# Patient Record
Sex: Female | Born: 2000 | Race: White | Hispanic: Yes | Marital: Single | Smoking: Current some day smoker
Health system: Southern US, Community
[De-identification: ages and names within clinical notes are randomized; demographics above are authoritative.]

---

## 2019-05-08 ENCOUNTER — Ambulatory Visit (LOCAL_COMMUNITY_HEALTH_CENTER): Payer: Medicaid Other | Admitting: Physician Assistant

## 2019-05-08 ENCOUNTER — Encounter: Payer: Self-pay | Admitting: Physician Assistant

## 2019-05-08 ENCOUNTER — Other Ambulatory Visit: Payer: Self-pay

## 2019-05-08 VITALS — BP 122/72 | Ht 64.0 in | Wt 124.0 lb

## 2019-05-08 DIAGNOSIS — Z30013 Encounter for initial prescription of injectable contraceptive: Secondary | ICD-10-CM | POA: Diagnosis not present

## 2019-05-08 DIAGNOSIS — Z113 Encounter for screening for infections with a predominantly sexual mode of transmission: Secondary | ICD-10-CM

## 2019-05-08 DIAGNOSIS — Z3009 Encounter for other general counseling and advice on contraception: Secondary | ICD-10-CM | POA: Diagnosis not present

## 2019-05-08 MED ORDER — MEDROXYPROGESTERONE ACETATE 150 MG/ML IM SUSP
150.0000 mg | INTRAMUSCULAR | Status: AC
  Administered 2019-05-08: 150 mg via INTRAMUSCULAR

## 2019-05-08 NOTE — Progress Notes (Signed)
Pt here as she is interested in starting Depo. Pt reports that the last time she had sex about 1 month ago she used ECP the day after she had unprotected sex. Pt desires blood work for HIV and syphillis.Ronny Bacon, RN

## 2019-05-08 NOTE — Progress Notes (Signed)
Pt received Depo 150mg  IM today per Antoine Primas, PA order and verbal order. Pt tolerated well. Provider orders completed.Ronny Bacon, RN

## 2019-05-09 NOTE — Progress Notes (Signed)
Family Planning Visit-  Subjective:  Courtney Gentry is a 18 y.o. being seen today  to discuss family planning options.    She is currently using none for pregnancy prevention. Patient reports she does not want a pregnancy in the next year. Patient  does not have a problem list on file.  Chief Complaint  Patient presents with  . Contraception    interested in Depo    Patient reports that she is interested in [facility].  LMP 04/19/2019 and per patient was on time and normal even though she had used Plan B a few days before her period.  Last sex 1 month ago.  Would like to start Depo today.  Declines pregnancy test today.  Reports that she sometimes smokes cigarettes.  Patient denies any chronic medical conditions, surgeries, regular medicines, clotting disorders, and migraines.  Denies family history of heart disease, diabetes, and cancer.   Does the patient desire a pregnancy in the next year? (OKQ flowsheet)  See flowsheet for other program required questions.   Body mass index is 21.28 kg/m. - Patient is eligible for diabetes screening based on BMI and age >73?  not applicable XT0G ordered? not applicable  Patient reports 1 of partners in last year. Desires STI screening?  No - .  Does the patient have a current or past history of drug use? No   No components found for: HCV]   Health Maintenance Due  Topic Date Due  . HIV Screening  09/04/2013  . TETANUS/TDAP  09/04/2017  . INFLUENZA VACCINE  02/28/2019    ROS  The following portions of the patient's history were reviewed and updated as appropriate: allergies, current medications, past family history, past medical history, past social history, past surgical history and problem list. Problem list updated.  Objective:   Vitals:   05/08/19 1421  BP: 122/72  Weight: 124 lb (56.2 kg)  Height: 5\' 4"  (1.626 m)    Physical Exam    Assessment and Plan:  Courtney Gentry is a 18 y.o. female presenting to the  [facility] for a family planning visit  Contraception counseling: Reviewed all forms of birth control options available including abstinence; over the counter/barrier methods; hormonal contraceptive medication including pill, patch, ring, injection,contraceptive implant; hormonal and nonhormonal IUDs; permanent sterilization options including vasectomy and the various tubal sterilization modalities. Risks and benefits reviewed.  Questions were answered.  Written information was also given to the patient to review.  Patient desires Depo, this was prescribed for patient. She will follow up in  3 months and prn for surveillance.  She was told to call with any further questions, or with any concerns about this method of contraception.  Emphasized use of condoms 100% of the time for STI prevention.  1. Encounter for counseling regarding contraception Counseled patient as above on BCMs. Patient opts to start Depo. Patient will need to RTC for IP/RP visit within the next 6 months for PE, pap, and to continue with Depo. Rec condoms with all sex for STD protection and especially for 2 weeks after shot today.  2. Initiation of Depo Provera OK for Depo 150 mg IM today and in 11-13 weeks. Rec condoms with all sex. - medroxyPROGESTERone (DEPO-PROVERA) injection 150 mg  3. Screening for STD (sexually transmitted disease) Patient declines pelvic today and GC/Chlamydia testing.  Requests blood work for HIV and RPR today. Await test results.  Counseled that RN will call if needs to RTC if needs  treatment once results are back.     Return in about 11 weeks (around 07/24/2019) for Physical and Depo, and PRN .  No future appointments.  Matt Holmes, PA

## 2019-05-20 NOTE — Addendum Note (Signed)
Addended by: Antoine Primas on: 05/20/2019 10:09 AM   Modules accepted: Orders

## 2019-06-09 NOTE — Addendum Note (Signed)
Addended by: Antoine Primas on: 06/09/2019 01:18 PM   Modules accepted: Orders

## 2019-07-30 ENCOUNTER — Ambulatory Visit (LOCAL_COMMUNITY_HEALTH_CENTER): Payer: Medicaid Other

## 2019-07-30 ENCOUNTER — Other Ambulatory Visit: Payer: Self-pay

## 2019-07-30 VITALS — BP 132/79 | Ht 64.0 in | Wt 127.0 lb

## 2019-07-30 DIAGNOSIS — Z3042 Encounter for surveillance of injectable contraceptive: Secondary | ICD-10-CM

## 2019-07-30 DIAGNOSIS — Z30013 Encounter for initial prescription of injectable contraceptive: Secondary | ICD-10-CM | POA: Diagnosis not present

## 2019-07-30 DIAGNOSIS — Z3009 Encounter for other general counseling and advice on contraception: Secondary | ICD-10-CM

## 2019-07-30 MED ORDER — MEDROXYPROGESTERONE ACETATE 150 MG/ML IM SUSP
150.0000 mg | Freq: Once | INTRAMUSCULAR | Status: AC
  Administered 2019-07-30: 09:00:00 150 mg via INTRAMUSCULAR

## 2019-07-30 NOTE — Progress Notes (Signed)
No physical at [facility]. Depo start at [facility] on 05/08/2019; 11.6 weeks post depo today. DMPA 150 mg IM administered today per Carla Hampton's order dated 05/08/2019. Pt must have physical before next depo is administered.

## 2019-10-21 ENCOUNTER — Other Ambulatory Visit: Payer: Self-pay | Admitting: Physician Assistant

## 2019-10-21 DIAGNOSIS — Z3042 Encounter for surveillance of injectable contraceptive: Secondary | ICD-10-CM

## 2019-10-21 MED ORDER — MEDROXYPROGESTERONE ACETATE 150 MG/ML IM SUSP
150.0000 mg | INTRAMUSCULAR | Status: AC
  Administered 2019-10-22 – 2020-01-08 (×2): 150 mg via INTRAMUSCULAR

## 2019-10-21 NOTE — Progress Notes (Signed)
Per chart review, patient needs PE and pap this year if possible due to COVID restrictions.  Provided that patient desires to continue with Depo and BP is normal, may continue with Depo.  Will need provider visit for pap and Depo in October/November.

## 2019-10-22 ENCOUNTER — Ambulatory Visit (LOCAL_COMMUNITY_HEALTH_CENTER): Payer: Medicaid Other

## 2019-10-22 ENCOUNTER — Other Ambulatory Visit: Payer: Self-pay

## 2019-10-22 VITALS — BP 128/75 | Ht 64.0 in | Wt 132.0 lb

## 2019-10-22 DIAGNOSIS — Z30013 Encounter for initial prescription of injectable contraceptive: Secondary | ICD-10-CM

## 2019-10-22 DIAGNOSIS — Z3009 Encounter for other general counseling and advice on contraception: Secondary | ICD-10-CM

## 2019-10-22 DIAGNOSIS — Z3042 Encounter for surveillance of injectable contraceptive: Secondary | ICD-10-CM

## 2019-10-22 NOTE — Progress Notes (Signed)
12.0 weeks post depo today. DMPA 150 mg IM administered per Sadie Haber, PA order dated 10/21/19 (depo order for 10/22/19 - 04/18/20).

## 2020-01-08 ENCOUNTER — Other Ambulatory Visit: Payer: Self-pay

## 2020-01-08 ENCOUNTER — Ambulatory Visit (LOCAL_COMMUNITY_HEALTH_CENTER): Payer: Medicaid Other

## 2020-01-08 VITALS — BP 114/80 | Ht 64.0 in | Wt 134.0 lb

## 2020-01-08 DIAGNOSIS — Z3009 Encounter for other general counseling and advice on contraception: Secondary | ICD-10-CM

## 2020-01-08 DIAGNOSIS — Z30013 Encounter for initial prescription of injectable contraceptive: Secondary | ICD-10-CM | POA: Diagnosis not present

## 2020-01-08 DIAGNOSIS — Z3042 Encounter for surveillance of injectable contraceptive: Secondary | ICD-10-CM

## 2020-01-08 NOTE — Progress Notes (Signed)
Pt. Is 11 weeks 1 day post depo. Depo 150mg  IM given today per order by C. Corydon, STAVANGER dated 10/21/19. Consent on file.  10/23/19, RN

## 2020-12-30 ENCOUNTER — Emergency Department
Admission: EM | Admit: 2020-12-30 | Discharge: 2020-12-30 | Disposition: A | Discharge status: Home / Self Care | Payer: Worker's Compensation | Attending: Emergency Medicine | Admitting: Emergency Medicine

## 2020-12-30 ENCOUNTER — Other Ambulatory Visit: Payer: Self-pay

## 2020-12-30 ENCOUNTER — Emergency Department: Payer: Worker's Compensation

## 2020-12-30 DIAGNOSIS — M25561 Pain in right knee: Secondary | ICD-10-CM

## 2020-12-30 DIAGNOSIS — T1490XA Injury, unspecified, initial encounter: Secondary | ICD-10-CM

## 2020-12-30 DIAGNOSIS — S80911A Unspecified superficial injury of right knee, initial encounter: Secondary | ICD-10-CM | POA: Diagnosis not present

## 2020-12-30 DIAGNOSIS — X500XXA Overexertion from strenuous movement or load, initial encounter: Secondary | ICD-10-CM | POA: Diagnosis not present

## 2020-12-30 DIAGNOSIS — F17210 Nicotine dependence, cigarettes, uncomplicated: Secondary | ICD-10-CM | POA: Insufficient documentation

## 2020-12-30 DIAGNOSIS — Y990 Civilian activity done for income or pay: Secondary | ICD-10-CM | POA: Diagnosis not present

## 2020-12-30 MED ORDER — IBUPROFEN 400 MG PO TABS
400.0000 mg | ORAL_TABLET | Freq: Once | ORAL | Status: AC
  Administered 2020-12-30: 400 mg via ORAL
  Filled 2020-12-30: qty 1

## 2020-12-30 MED ORDER — ACETAMINOPHEN 500 MG PO TABS
1000.0000 mg | ORAL_TABLET | Freq: Once | ORAL | Status: AC
  Administered 2020-12-30: 1000 mg via ORAL
  Filled 2020-12-30: qty 2

## 2020-12-30 NOTE — ED Notes (Signed)
See triage note  Presents with right knee pain  States she had an accident at work 2 days ago  States something fell on knee  Needs interpreter

## 2020-12-30 NOTE — ED Provider Notes (Signed)
[facility] Emergency Department Provider Note  ____________________________________________   Event Date/Time   First MD Initiated Contact with Patient 12/30/20 1450     (approximate)  I have reviewed the triage vital signs and the nursing notes.   HISTORY  Chief Complaint Knee Pain   HPI Courtney Gentry is a 20 y.o. female without significant past medical history presents for assessment approximately 3 days of pain in her right knee after she states that a pallet fell onto it while she was at work.  She states that she has been able to bear weight albeit with some discomfort.  She denies any other injuries associated with this pallet falling onto her including any pain in her ankle or hip.  She has not taken any medications for her pain.  She denies any other associated sick symptoms including fevers, chills, headache, earache, sore throat, nausea, vomiting, diarrhea, dysuria, rash or any other acute sick symptoms.  No other acute concerns at this time.  States that she is currently on her menstrual period.  She does not recall any deformity at the time that was spontaneously reduced.         No past medical history on file.  There are no problems to display for this patient.     Prior to Admission medications   Medication Sig Start Date End Date Taking? Authorizing Provider  cholecalciferol (VITAMIN D3) 25 MCG (1000 UNIT) tablet Take 1,000 Units by mouth daily. Pt unsure of dosage    [provider]    Allergies Patient has no known allergies.  No family history on file.  Social History Social History   Tobacco Use  . Smoking status: Current Some Day Smoker    Types: Cigarettes    Start date: 06/07/2018  . Smokeless tobacco: Never Used    Review of Systems  Review of Systems  Constitutional: Negative for chills and fever.  HENT: Negative for sore throat.   Eyes: Negative for pain.  Respiratory: Negative for cough  and stridor.   Cardiovascular: Negative for chest pain.  Gastrointestinal: Negative for vomiting.  Genitourinary: Negative for dysuria.  Musculoskeletal: Positive for joint pain ( R knee). Negative for myalgias.  Skin: Negative for rash.  Neurological: Negative for seizures, loss of consciousness and headaches.  Psychiatric/Behavioral: Negative for suicidal ideas.  All other systems reviewed and are negative.     ____________________________________________   PHYSICAL EXAM:  VITAL SIGNS: ED Triage Vitals  Enc Vitals Group     BP 12/30/20 1345 119/77     Pulse Rate 12/30/20 1345 71     Resp 12/30/20 1345 16     Temp 12/30/20 1345 98.9 F (37.2 C)     Temp Source 12/30/20 1345 Oral     SpO2 12/30/20 1345 100 %     Weight 12/30/20 1353 165 lb (74.8 kg)     Height 12/30/20 1353 5\' 8"  (1.727 m)     Head Circumference --      Peak Flow --      Pain Score 12/30/20 1353 7     Pain Loc --      Pain Edu? --      Excl. in GC? --    Vitals:   12/30/20 1345  BP: 119/77  Pulse: 71  Resp: 16  Temp: 98.9 F (37.2 C)  SpO2: 100%   Physical Exam Vitals and nursing note reviewed.  Constitutional:      General: She is not in  acute distress.    Appearance: She is well-developed.  HENT:     Head: Normocephalic and atraumatic.     Right Ear: External ear normal.     Left Ear: External ear normal.     Nose: Nose normal.  Eyes:     Conjunctiva/sclera: Conjunctivae normal.  Cardiovascular:     Rate and Rhythm: Normal rate and regular rhythm.     Heart sounds: No murmur heard.   Pulmonary:     Effort: Pulmonary effort is normal. No respiratory distress.  Abdominal:     Palpations: Abdomen is soft.     Tenderness: There is no abdominal tenderness.  Musculoskeletal:     Cervical back: Neck supple.  Skin:    General: Skin is warm and dry.  Neurological:     Mental Status: She is alert and oriented to person, place, and time.  Psychiatric:        Mood and Affect: Mood  normal.     Right knee has some tenderness and a little bit of pain elicited on valgus testing but no laxity deformity or large effusion.  No significant overlying skin changes.  She is neurovascular intact distally. ____________________________________________   LABS (all labs ordered are listed, but only abnormal results are displayed)  Labs Reviewed - No data to display ____________________________________________  EKG  ____________________________________________  RADIOLOGY  ED MD interpretation: No fracture or dislocation.  Official radiology report(s): DG Knee Complete 4 Views Right  Result Date: 12/30/2020 CLINICAL DATA:  Knee injury EXAM: RIGHT KNEE - COMPLETE 4+ VIEW COMPARISON:  None. FINDINGS: No evidence of fracture, dislocation, or joint effusion. No evidence of arthropathy or other focal bone abnormality. Soft tissues are unremarkable. IMPRESSION: Negative. Electronically Signed   By: Guadlupe Spanish M.D.   On: 12/30/2020 14:50    ____________________________________________   PROCEDURES  Procedure(s) performed (including Critical Care):  Procedures   ____________________________________________   INITIAL IMPRESSION / ASSESSMENT AND PLAN / ED COURSE      Patient presents with above-stated history exam for assessment of a mechanical injury to the right knee described above.  On arrival she is afebrile hemodynamically stable.  No fracture dislocation noted on plain film and she is neurovascularly intact distally.  She does have little tenderness of the medial aspect and a little pain elicited on valgus testing concerning for possible medial collateral ligament injury.  Will place in sleeve brace and outpatient follow-up with Ortho.  Low suspicion for septic joint, spontaneous reduced dislocation or other significant injury.  No findings to suggest acute infectious process.  Advise she can take OTC pain meds.  Discharged stable condition.  Strict return precautions  advised and discussed.        ____________________________________________   FINAL CLINICAL IMPRESSION(S) / ED DIAGNOSES  Final diagnoses:  Injury  Acute pain of right knee    Medications  acetaminophen (TYLENOL) tablet 1,000 mg (has no administration in time range)  ibuprofen (ADVIL) tablet 400 mg (has no administration in time range)     ED Discharge Orders    None       Note:  This document was prepared using Dragon voice recognition software and may include unintentional dictation errors.   Gilles Chiquito, MD 12/30/20 [phone]

## 2020-12-30 NOTE — ED Triage Notes (Addendum)
Pt here with an accident at work on Wed and is not getting better. Pt fell while pulling a lift and it fell on top of her. Pt works at [employer] in [place].

## 2021-05-19 ENCOUNTER — Other Ambulatory Visit: Payer: Self-pay | Admitting: Family Medicine

## 2021-05-19 DIAGNOSIS — Z3401 Encounter for supervision of normal first pregnancy, first trimester: Secondary | ICD-10-CM

## 2021-05-31 ENCOUNTER — Other Ambulatory Visit: Payer: Self-pay

## 2021-05-31 ENCOUNTER — Ambulatory Visit
Admission: RE | Admit: 2021-05-31 | Discharge: 2021-05-31 | Disposition: A | Discharge status: Home / Self Care | Payer: Medicaid Other | Source: Ambulatory Visit | Attending: Family Medicine | Admitting: Family Medicine

## 2021-05-31 ENCOUNTER — Other Ambulatory Visit: Payer: Self-pay | Admitting: Family Medicine

## 2021-05-31 DIAGNOSIS — Z3401 Encounter for supervision of normal first pregnancy, first trimester: Secondary | ICD-10-CM | POA: Insufficient documentation

## 2021-11-13 ENCOUNTER — Telehealth: Payer: Self-pay | Admitting: Licensed Clinical Social Worker

## 2021-11-13 NOTE — Telephone Encounter (Signed)
-----   Message from Kieth Brightly sent at 11/10/2021 10:35 AM EDT ----- ?Regarding: RE: [facility] referral ?Ok. Thanks again. Have a great weekend.  ?----- Message ----- ?From: Milton Ferguson, LCSW ?Sent: 11/09/2021   5:10 PM EDT ?To: Kieth Brightly ?Subject: RE: MH referral                               ? ?Good evening! Sure thing, I will do!  ?----- Message ----- ?From: Kieth Brightly ?Sent: 11/09/2021   4:47 PM EDT ?To: Milton Ferguson, LCSW ?Subject: MH referral                                   ? ?[place] all is well.  At your convenience, would you please f/u with this member on some possible pp depression? ?Thanks,  ? ? ? ?

## 2021-11-15 NOTE — Telephone Encounter (Signed)
TELEHEALTH VIRTUAL MOOD VISIT ENCOUNTER NOTE ? ?I connected with Courtney Gentry on 11/15/21 by telephone and verified that I am speaking with the correct person using two identifiers. ?  ?I discussed the limitations, risks, security and privacy concerns of performing an evaluation and management service by telephone and the availability of in person appointments. I also discussed with the patient that there may be a patient responsible charge related to this service. The patient expressed understanding and agreed to proceed. ? ?LCSW introduced self and established psychological safety.  ?Patient verbally consented to this telephonic session.  ? ?Patient is located at Home  ?LCSW located at [facility] ? ?Time visit started: 11:50 ?Time visit ended: 11:58 ? ?SUBJECTIVE: ?Patient was referred by Ellison Carwin, MSW Care Manager for possible postpartum depression symptoms.  ? ?ASSESSMENT: ?Patient currently experiencing Denies any concerns. Reports supportive husband, sleeping well and that things are fine.  ? ? ?PRESENTING CONCERNS: ?Patient and/or family reports the following symptoms/concerns: Patient denies any current concerns or symptoms. She reports she was feeling a little depressed for about 2-3 days after delivery but those feelings has subsided.  ?Duration of problem: 3 days; Severity of problem:  No longer a problem/concerns  ? ?STRENGTHS (Protective Factors/Coping Skills): ?Supportive husband, sleeping well, no concerns.  ? ?INTERVENTIONS: ?Interventions utilized:  Psychoeducation and/or Health Education and Brief assessment  ? ?Conducted brief assessment  ?Provide brief psychoeducation on postpartum mood and anxiety disorders signs and symptoms, including information on Baby Blues, Postpartum Depression, and Postpartum Anxiety.   ?Discussed self-care strategies to prevent and/or reduce symptoms of postpartum mood and anxiety disorders, including sleep hygiene, eating regularly, drinking fluids, getting  outside, and support from partner, family, and/or friends.  ? ?Informed patient to call her provider right away or get emergency help if she experiences any of the following symptoms: ?Feelings of hopelessness and total despair. ?Feeling out of touch with reality (hearing or seeing things other people don't). ?Feeling that you might hurt yourself or your baby. ? ?PLAN: ?Patient to reach out if needed, LCSW provided contact number ? ?Roddie Mc Yemen, Interpreter  ?Kathreen Cosier  ? ?

## 2022-06-01 ENCOUNTER — Other Ambulatory Visit: Payer: Self-pay | Admitting: Family Medicine

## 2022-06-01 DIAGNOSIS — Z331 Pregnant state, incidental: Secondary | ICD-10-CM

## 2022-06-12 ENCOUNTER — Ambulatory Visit: Admission: RE | Admit: 2022-06-12 | Payer: Medicaid Other | Source: Ambulatory Visit

## 2022-06-19 ENCOUNTER — Ambulatory Visit
Admission: RE | Admit: 2022-06-19 | Discharge: 2022-06-19 | Disposition: A | Discharge status: Home / Self Care | Payer: Medicaid Other | Source: Ambulatory Visit | Attending: Family Medicine | Admitting: Family Medicine

## 2022-06-19 ENCOUNTER — Other Ambulatory Visit: Payer: Self-pay | Admitting: Family Medicine

## 2022-06-19 DIAGNOSIS — Z331 Pregnant state, incidental: Secondary | ICD-10-CM | POA: Insufficient documentation

## 2022-06-19 DIAGNOSIS — Z3A12 12 weeks gestation of pregnancy: Secondary | ICD-10-CM | POA: Insufficient documentation

## 2022-06-19 DIAGNOSIS — Z3689 Encounter for other specified antenatal screening: Secondary | ICD-10-CM | POA: Diagnosis not present

## 2022-06-19 DIAGNOSIS — Z3687 Encounter for antenatal screening for uncertain dates: Secondary | ICD-10-CM | POA: Insufficient documentation

## 2022-08-07 ENCOUNTER — Other Ambulatory Visit: Payer: Self-pay | Admitting: Family Medicine

## 2022-08-07 DIAGNOSIS — Z3481 Encounter for supervision of other normal pregnancy, first trimester: Secondary | ICD-10-CM

## 2022-08-13 ENCOUNTER — Ambulatory Visit
Admission: RE | Admit: 2022-08-13 | Discharge: 2022-08-13 | Disposition: A | Discharge status: Home / Self Care | Payer: Medicaid Other | Source: Ambulatory Visit | Attending: Family Medicine | Admitting: Family Medicine

## 2022-08-13 DIAGNOSIS — O321XX0 Maternal care for breech presentation, not applicable or unspecified: Secondary | ICD-10-CM | POA: Diagnosis not present

## 2022-08-13 DIAGNOSIS — Z3481 Encounter for supervision of other normal pregnancy, first trimester: Secondary | ICD-10-CM

## 2022-08-13 DIAGNOSIS — Z3A20 20 weeks gestation of pregnancy: Secondary | ICD-10-CM | POA: Diagnosis not present

## 2022-11-04 IMAGING — CR DG KNEE COMPLETE 4+V*R*
4 series · 4 of 4 positions shown · non-contrast
Comparison: None.

CLINICAL DATA: Knee injury

EXAM:
RIGHT KNEE - COMPLETE 4+ VIEW

[knee ap]
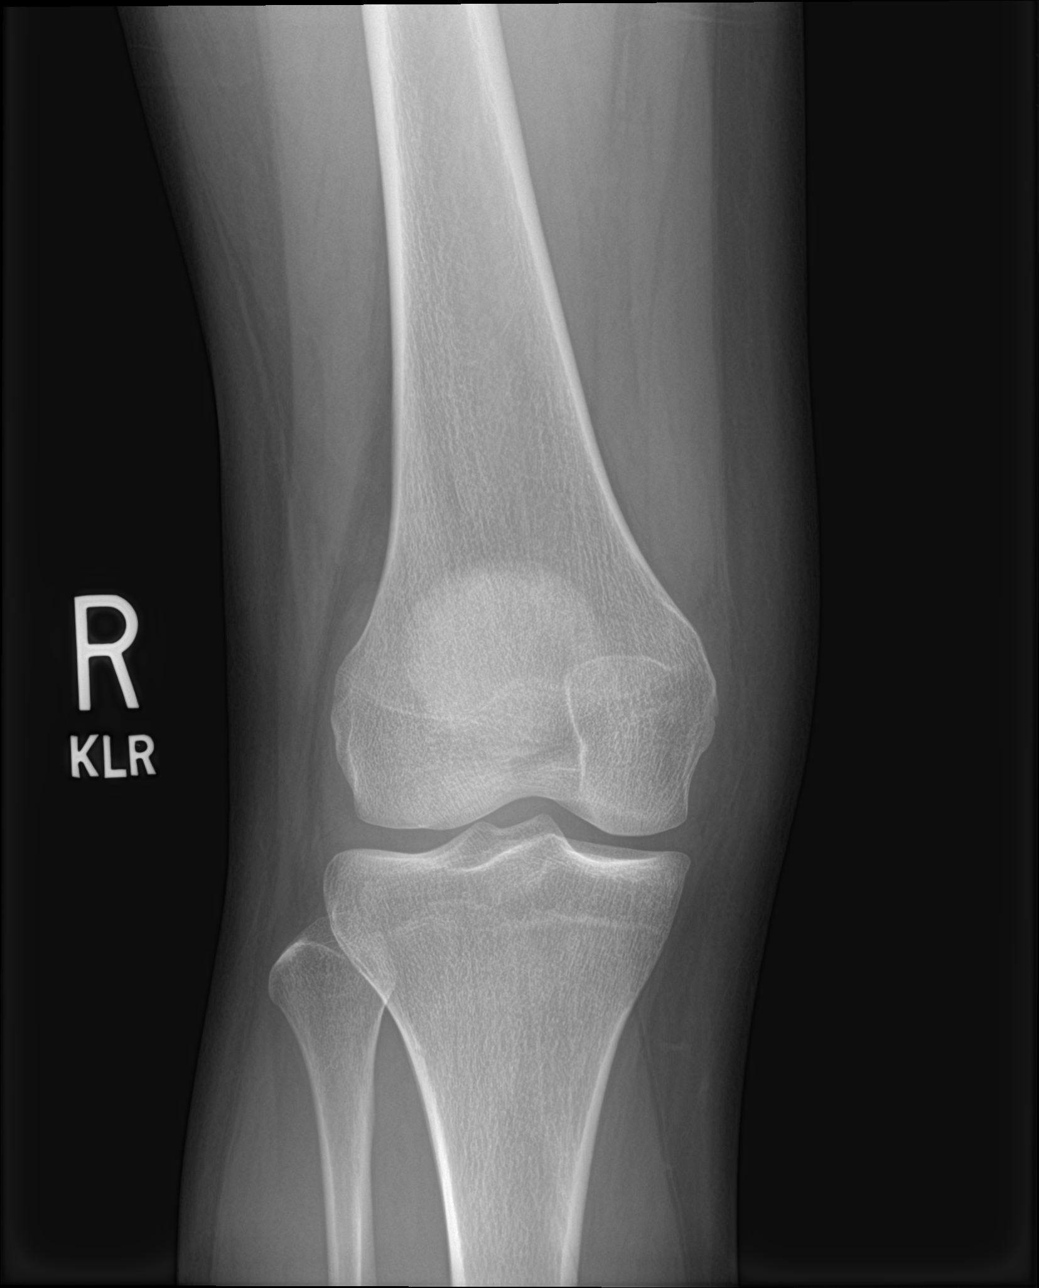

[knee obl (1 of 2)]
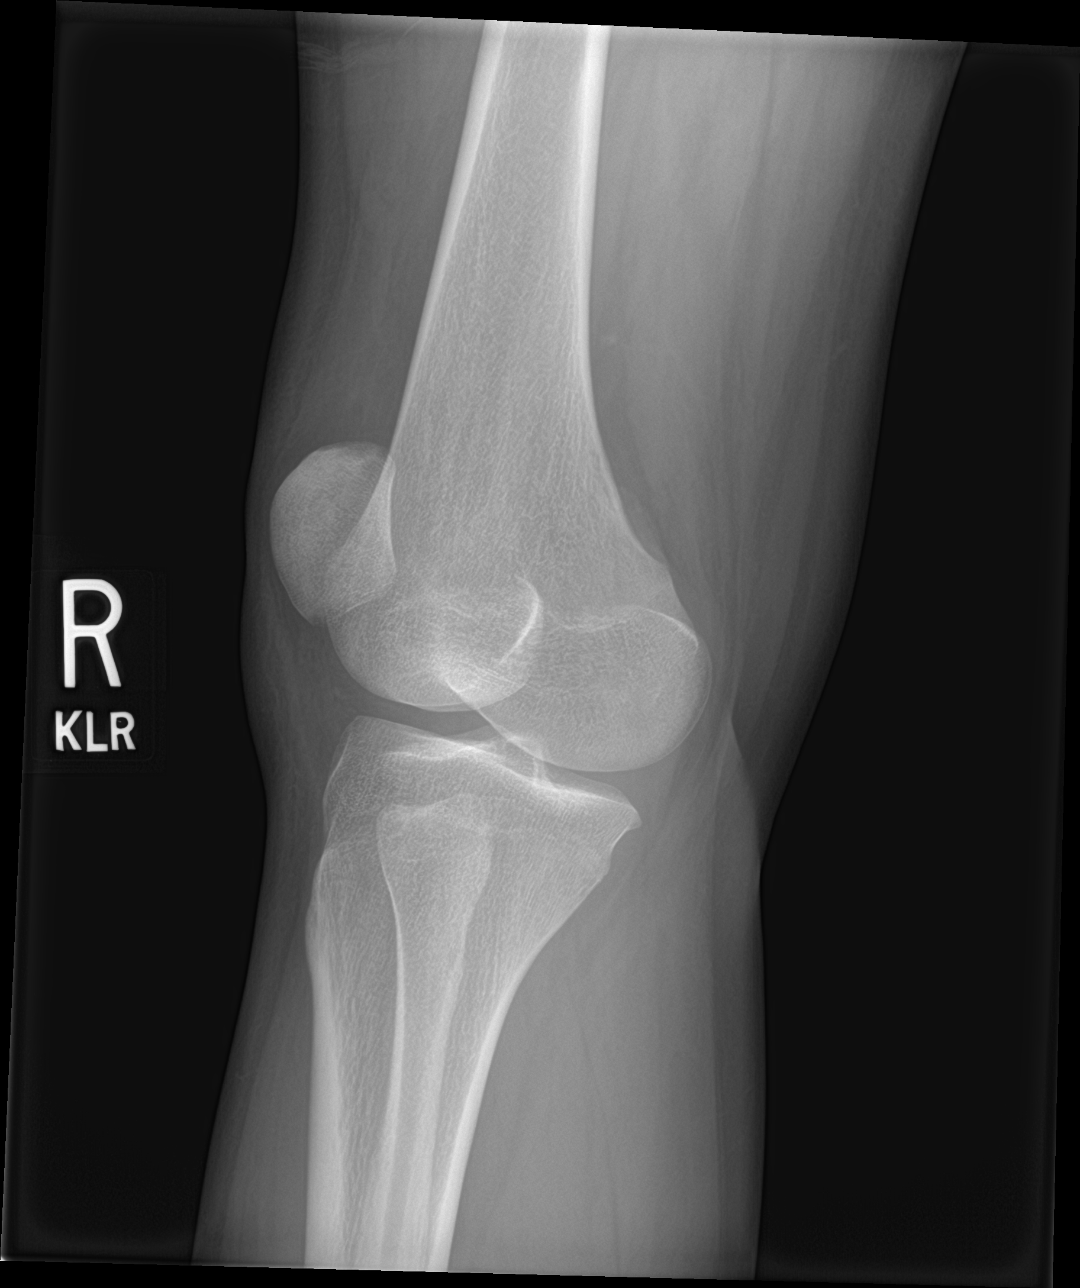

[knee obl (2 of 2)]
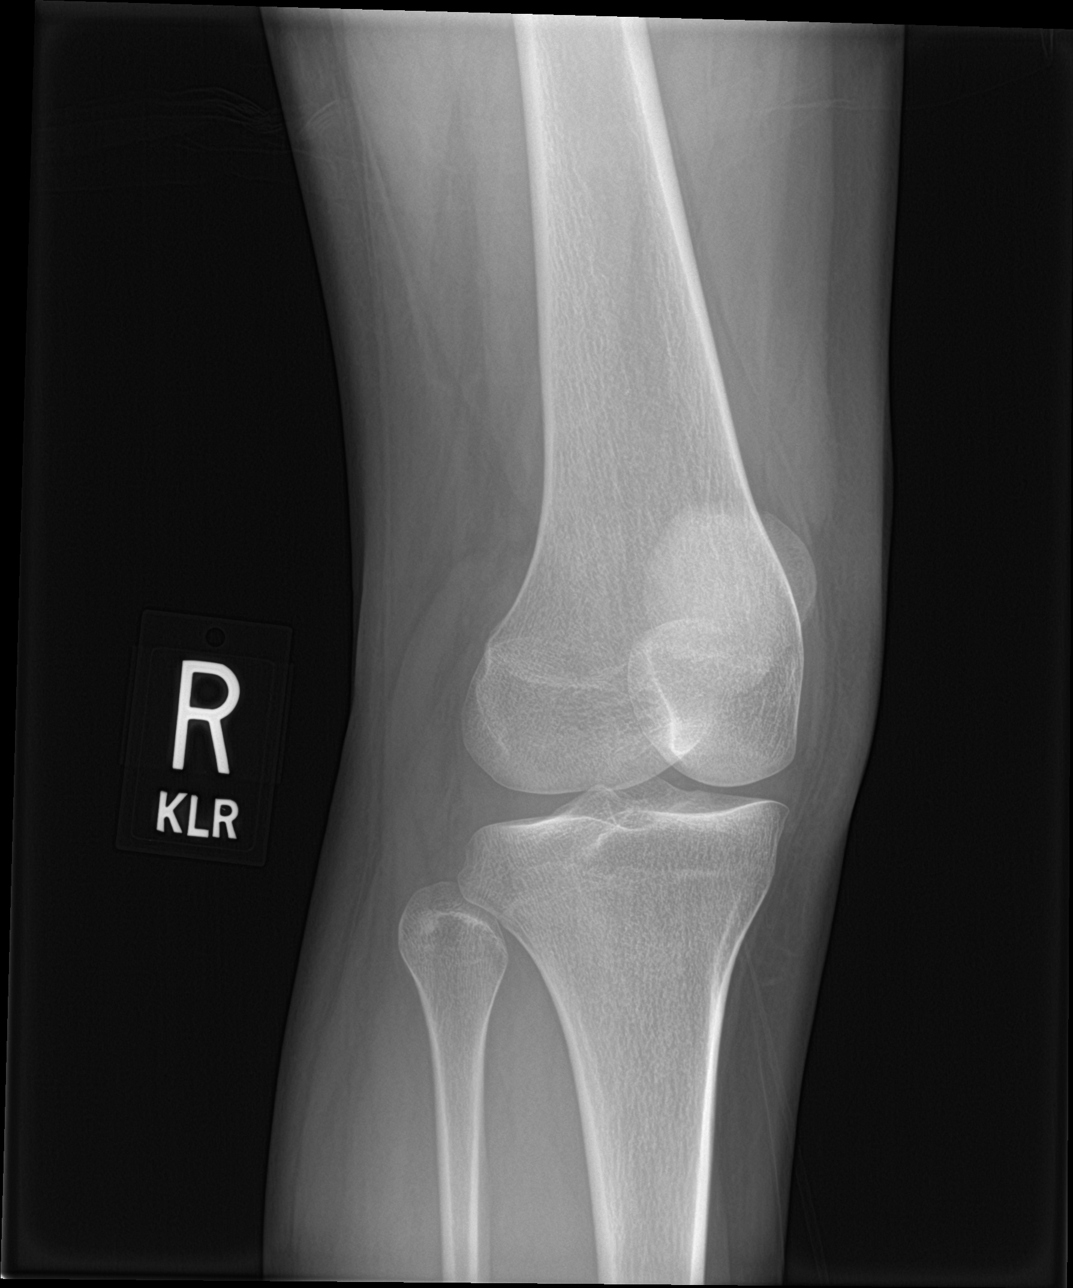

[knee lat]
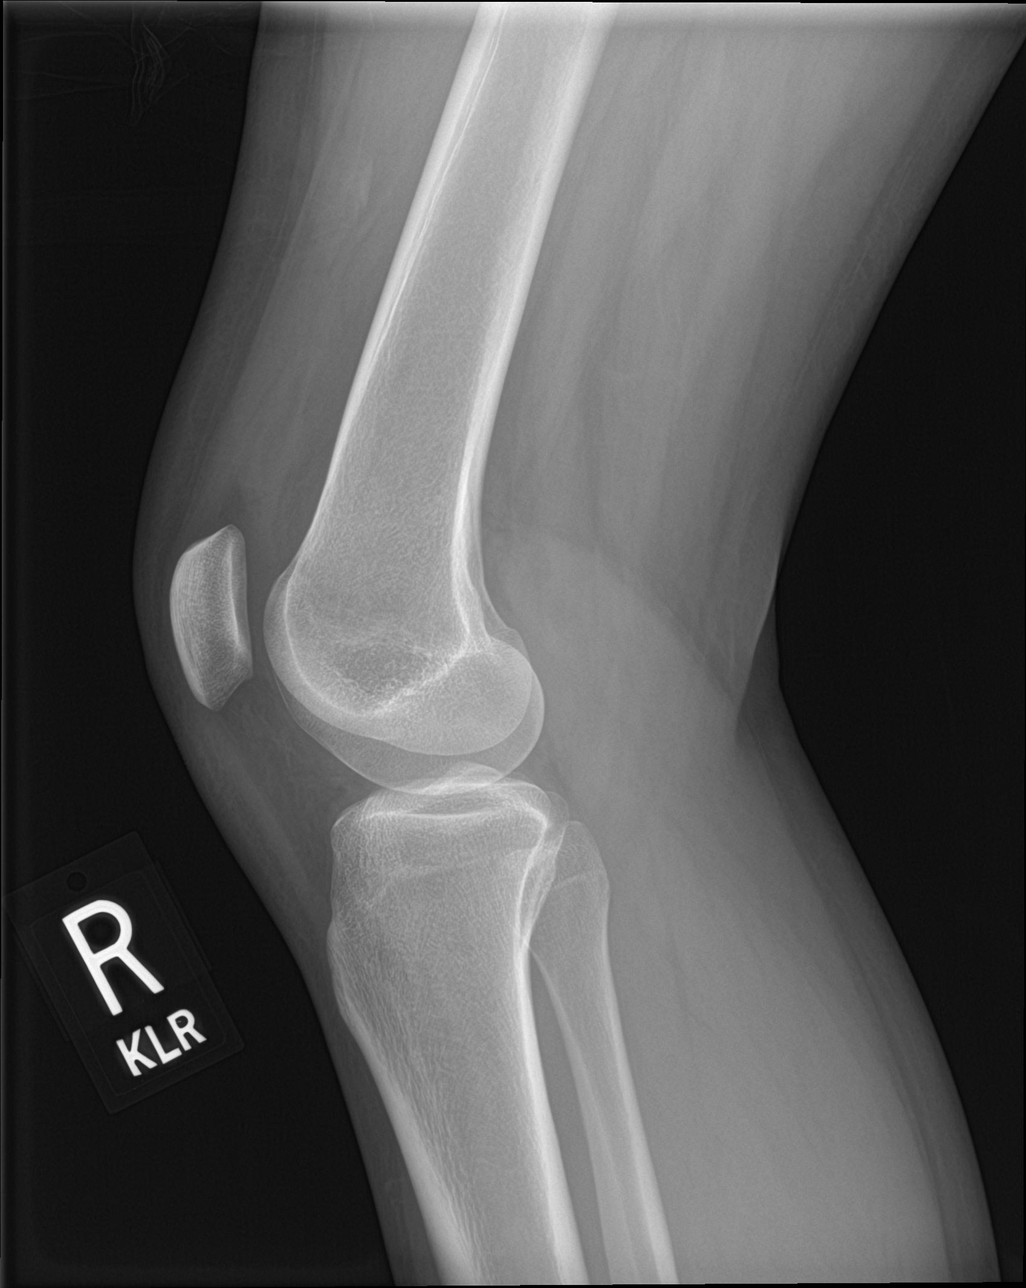

[4 of 4 positions shown; findings below may reference images not displayed]

FINDINGS: No evidence of fracture, dislocation, or joint effusion. No evidence
of arthropathy or other focal bone abnormality. Soft tissues are
unremarkable.
IMPRESSION: Negative.

## 2023-04-05 IMAGING — US US OB COMP +14 WK
1 series · 15 of 28 positions shown · non-contrast
Comparison: none

CLINICAL DATA: Unknown LMP.  Primigravida.

EXAM:
OBSTETRICAL ULTRASOUND >14 WKS

[Series 1: us ob comp less 14 wks · 15 of 68 slices shown]
[im 1/68]
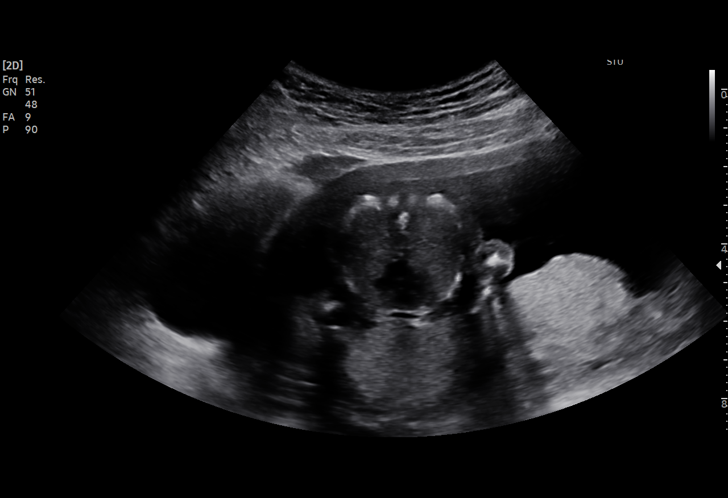
[im 5/68]
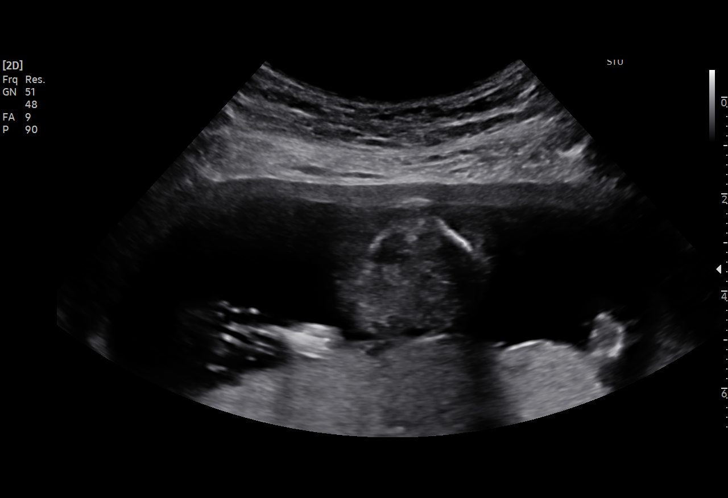
[im 10/68]
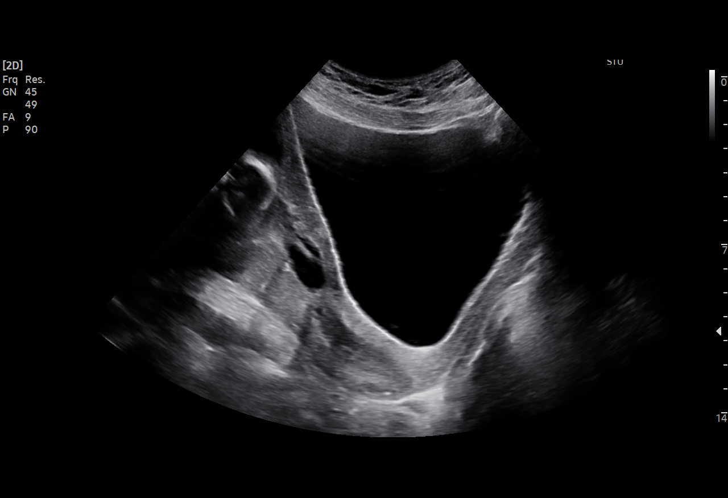
[im 15/68]
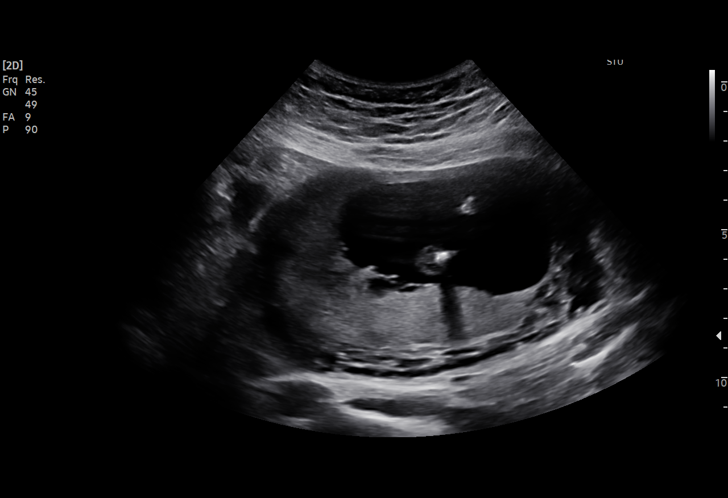
[im 20/68]
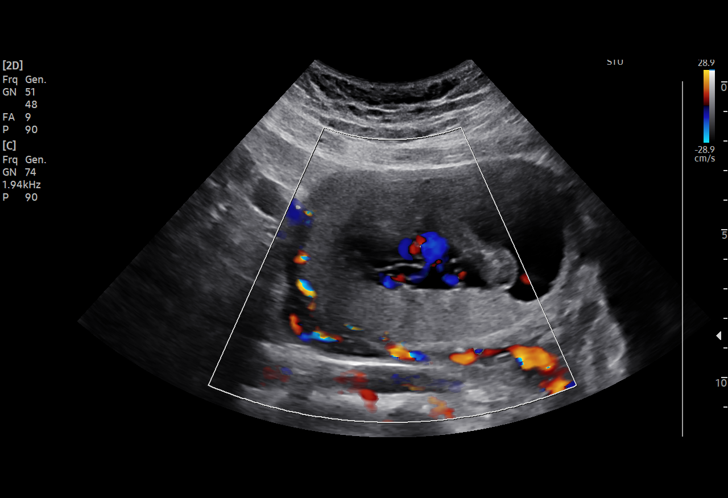
[im 25/68]
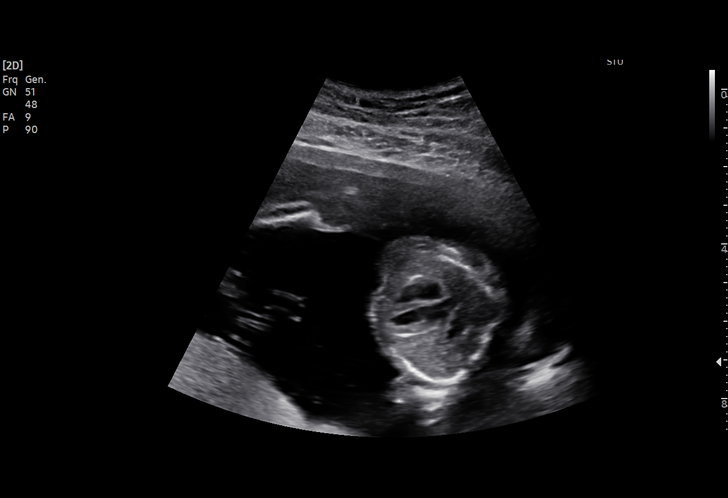
[im 30/68]
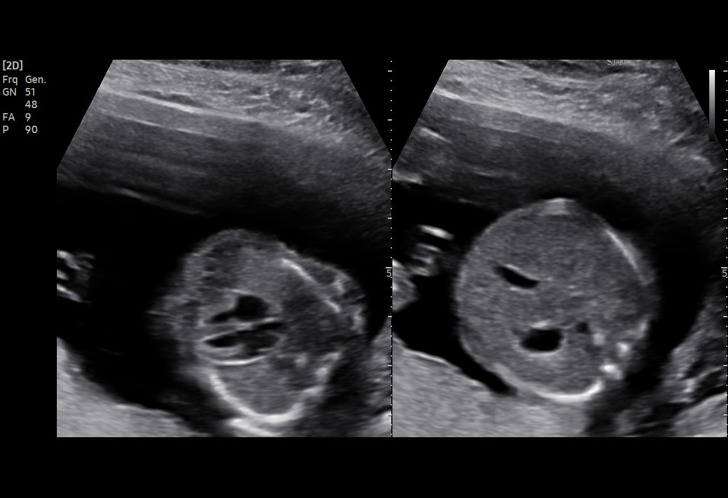
[im 35/68]
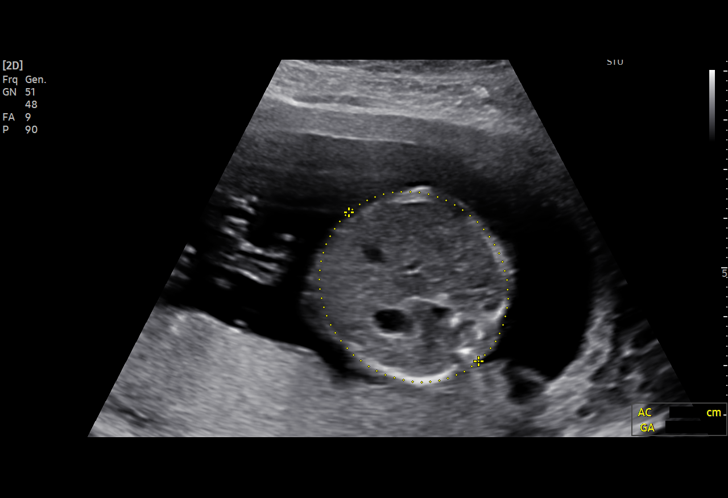
[im 38/68]
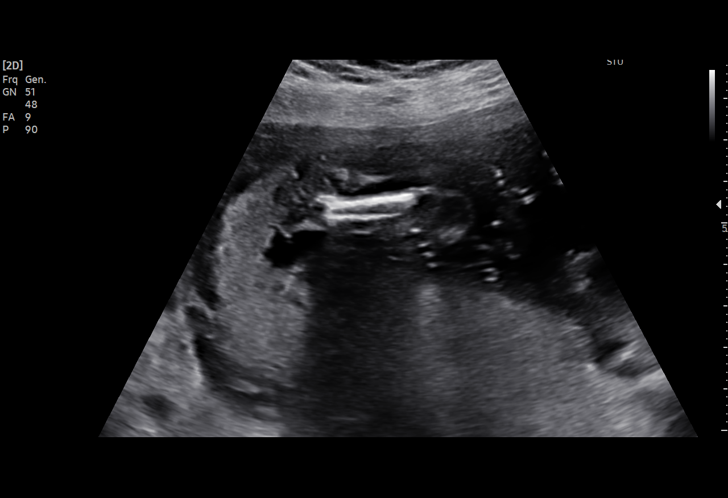
[im 43/68]
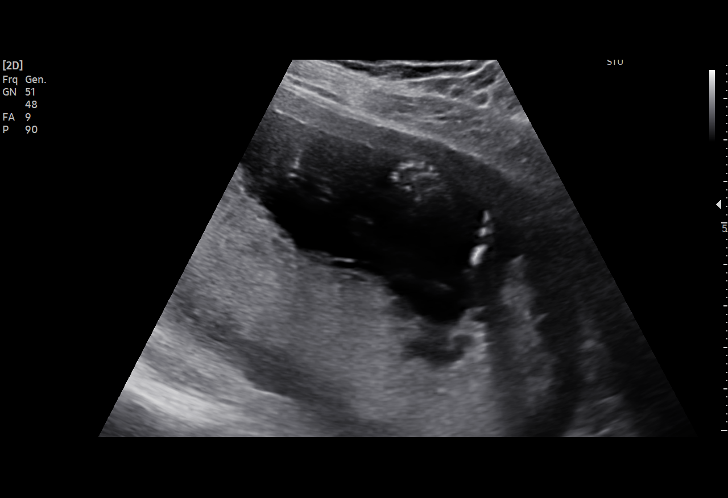
[im 48/68]
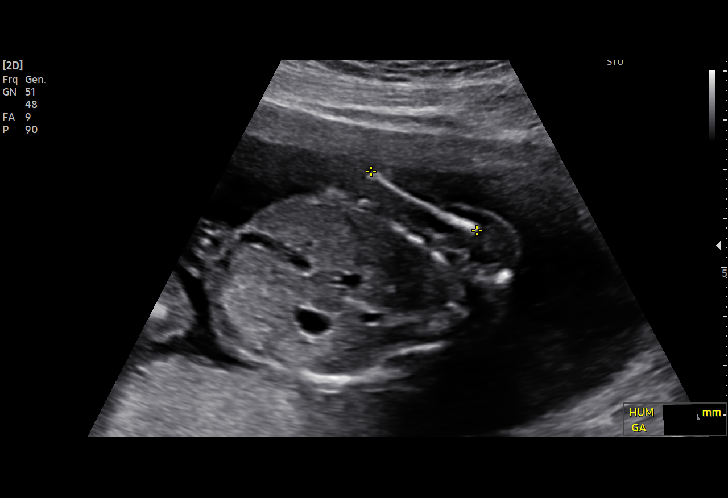
[im 53/68]
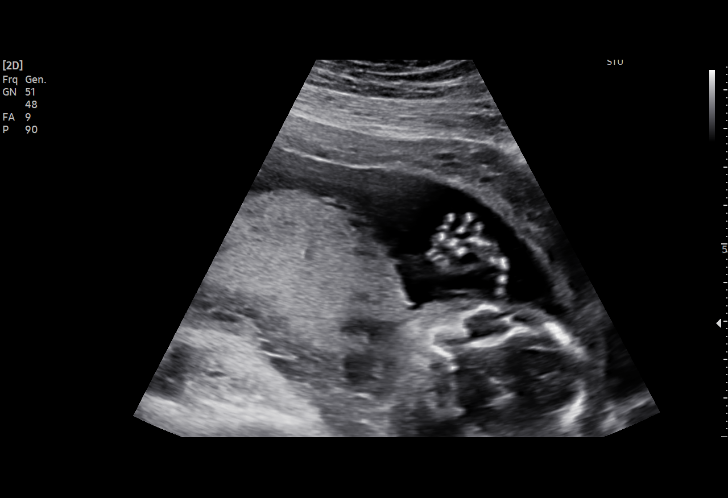
[im 58/68]
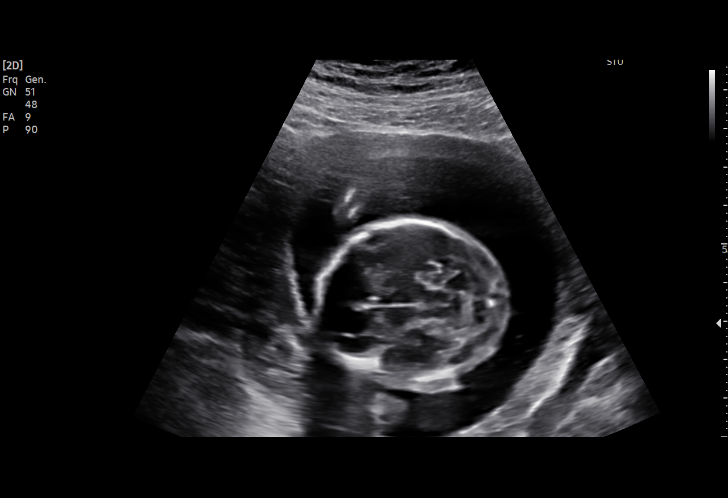
[im 63/68]
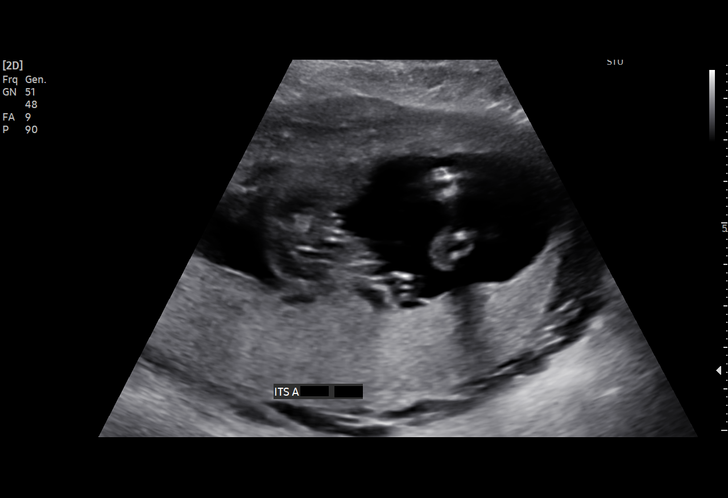
[im 68/68]
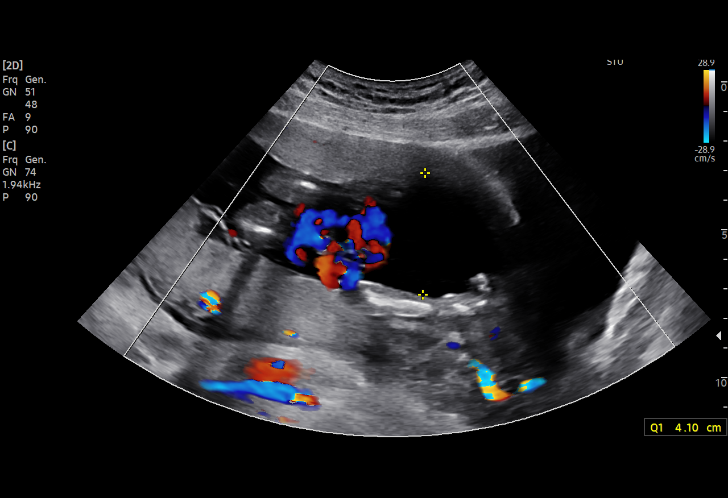

[15 of 28 positions shown; findings below may reference images not displayed]

FINDINGS: Number of Fetuses: 1

Heart Rate:  141 bpm

Movement: Yes

Presentation: Cephalic

Previa: No

Placental Location: Posterior

Amniotic Fluid (Subjective): Within normal limits

Amniotic Fluid (Objective):

Vertical pocket = 4.1cm

FETAL BIOMETRY

BPD: 4.2cm 18w 4d

HC:   15.7cm 18w 4d

AC:   12.1cm 17w 5d

FL:   2.6cm 18w 0d

Current Mean GA: 17w 6d US EDC: 11/02/2021

FETAL ANATOMY

Lateral Ventricles: Appears normal

Thalami/CSP: Appears normal

Posterior Fossa:  Appears normal

Nuchal Region: Appears normal   NFT= 3.1 mm

Upper Lip: Appears normal

Spine: Appears normal

4 Chamber Heart on Left: Appears normal

LVOT: Appears normal

RVOT: Appears normal

Stomach on Left: Appears normal

3 Vessel Cord: Appears normal

Cord Insertion site: Appears normal

Kidneys: Appears normal

Bladder: Appears normal

Extremities: Appears normal

Maternal Findings:

Cervix:  5.1 cm TA
IMPRESSION: Single living IUP with estimated gestational age of 17 weeks 6 days,
and US EDC of 11/02/2021.

Unremarkable fetal anatomic survey.  No fetal anomalies identified.
